# Patient Record
Sex: Male | Born: 1955 | Race: White | Hispanic: No | Marital: Married | Smoking: Current every day smoker
Health system: Southern US, Community
[De-identification: ages and names within clinical notes are randomized; demographics above are authoritative.]

## PROBLEM LIST (undated history)

## (undated) DIAGNOSIS — I639 Cerebral infarction, unspecified: Secondary | ICD-10-CM

## (undated) DIAGNOSIS — I219 Acute myocardial infarction, unspecified: Secondary | ICD-10-CM

## (undated) DIAGNOSIS — E119 Type 2 diabetes mellitus without complications: Secondary | ICD-10-CM

---

## 2005-07-15 ENCOUNTER — Inpatient Hospital Stay (HOSPITAL_COMMUNITY): Admission: EM | Admit: 2005-07-15 | Discharge: 2005-07-19 | Payer: Self-pay | Admitting: Emergency Medicine

## 2005-07-15 ENCOUNTER — Ambulatory Visit: Payer: Self-pay | Admitting: Cardiology

## 2005-07-16 ENCOUNTER — Encounter (INDEPENDENT_AMBULATORY_CARE_PROVIDER_SITE_OTHER): Payer: Self-pay | Admitting: Neurology

## 2005-07-16 ENCOUNTER — Encounter: Payer: Self-pay | Admitting: Cardiology

## 2005-07-18 ENCOUNTER — Encounter: Payer: Self-pay | Admitting: Cardiology

## 2005-09-30 ENCOUNTER — Ambulatory Visit: Payer: Self-pay | Admitting: Internal Medicine

## 2005-09-30 ENCOUNTER — Inpatient Hospital Stay (HOSPITAL_COMMUNITY): Admission: EM | Admit: 2005-09-30 | Discharge: 2005-10-02 | Payer: Self-pay | Admitting: Emergency Medicine

## 2008-11-02 ENCOUNTER — Ambulatory Visit: Payer: Self-pay | Admitting: Family Medicine

## 2008-11-02 ENCOUNTER — Inpatient Hospital Stay (HOSPITAL_COMMUNITY): Admission: EM | Admit: 2008-11-02 | Discharge: 2008-11-09 | Payer: Self-pay | Admitting: Emergency Medicine

## 2009-05-08 ENCOUNTER — Ambulatory Visit (HOSPITAL_COMMUNITY): Admission: RE | Admit: 2009-05-08 | Discharge: 2009-05-08 | Payer: Self-pay | Admitting: Ophthalmology

## 2009-05-17 ENCOUNTER — Encounter: Payer: Self-pay | Admitting: Cardiology

## 2010-03-05 NOTE — Letter (Signed)
Summary: Discharge Summary 10-02-2005  Discharge Summary 10-02-2005   Imported By: Faythe Ghee 05/17/2009 14:56:56  _____________________________________________________________________  External Attachment:    Type:   Image     Comment:   External Document

## 2010-05-09 LAB — GLUCOSE, CAPILLARY
Glucose-Capillary: 141 mg/dL — ABNORMAL HIGH (ref 70–99)
Glucose-Capillary: 147 mg/dL — ABNORMAL HIGH (ref 70–99)
Glucose-Capillary: 163 mg/dL — ABNORMAL HIGH (ref 70–99)
Glucose-Capillary: 167 mg/dL — ABNORMAL HIGH (ref 70–99)
Glucose-Capillary: 184 mg/dL — ABNORMAL HIGH (ref 70–99)
Glucose-Capillary: 190 mg/dL — ABNORMAL HIGH (ref 70–99)
Glucose-Capillary: 192 mg/dL — ABNORMAL HIGH (ref 70–99)
Glucose-Capillary: 197 mg/dL — ABNORMAL HIGH (ref 70–99)
Glucose-Capillary: 201 mg/dL — ABNORMAL HIGH (ref 70–99)
Glucose-Capillary: 226 mg/dL — ABNORMAL HIGH (ref 70–99)
Glucose-Capillary: 229 mg/dL — ABNORMAL HIGH (ref 70–99)
Glucose-Capillary: 259 mg/dL — ABNORMAL HIGH (ref 70–99)
Glucose-Capillary: 263 mg/dL — ABNORMAL HIGH (ref 70–99)
Glucose-Capillary: 267 mg/dL — ABNORMAL HIGH (ref 70–99)

## 2010-05-09 LAB — COMPREHENSIVE METABOLIC PANEL
ALT: 13 U/L (ref 0–53)
AST: 23 U/L (ref 0–37)
Albumin: 2.2 g/dL — ABNORMAL LOW (ref 3.5–5.2)
Albumin: 2.4 g/dL — ABNORMAL LOW (ref 3.5–5.2)
Albumin: 2.4 g/dL — ABNORMAL LOW (ref 3.5–5.2)
BUN: 133 mg/dL — ABNORMAL HIGH (ref 6–23)
BUN: 18 mg/dL (ref 6–23)
BUN: 30 mg/dL — ABNORMAL HIGH (ref 6–23)
CO2: 27 mEq/L (ref 19–32)
Calcium: 7.3 mg/dL — ABNORMAL LOW (ref 8.4–10.5)
Calcium: 7.4 mg/dL — ABNORMAL LOW (ref 8.4–10.5)
Chloride: 102 mEq/L (ref 96–112)
Chloride: 91 mEq/L — ABNORMAL LOW (ref 96–112)
Creatinine, Ser: 1.24 mg/dL (ref 0.4–1.5)
Creatinine, Ser: 1.46 mg/dL (ref 0.4–1.5)
GFR calc Af Amer: 8 mL/min — ABNORMAL LOW (ref >60)
GFR calc non Af Amer: 7 mL/min — ABNORMAL LOW (ref >60)
Glucose, Bld: 129 mg/dL — ABNORMAL HIGH (ref 70–99)
Glucose, Bld: 231 mg/dL — ABNORMAL HIGH (ref 70–99)
Sodium: 140 mEq/L (ref 135–145)
Total Bilirubin: 0.7 mg/dL (ref 0.3–1.2)
Total Bilirubin: 1.2 mg/dL (ref 0.3–1.2)
Total Protein: 5.5 g/dL — ABNORMAL LOW (ref 6.0–8.3)
Total Protein: 5.9 g/dL — ABNORMAL LOW (ref 6.0–8.3)

## 2010-05-09 LAB — RENAL FUNCTION PANEL
CO2: 26 mEq/L (ref 19–32)
Calcium: 7.1 mg/dL — ABNORMAL LOW (ref 8.4–10.5)
Creatinine, Ser: 2.96 mg/dL — ABNORMAL HIGH (ref 0.4–1.5)
GFR calc Af Amer: 27 mL/min — ABNORMAL LOW (ref >60)
GFR calc non Af Amer: 22 mL/min — ABNORMAL LOW (ref >60)
Glucose, Bld: 164 mg/dL — ABNORMAL HIGH (ref 70–99)
Potassium: 3 mEq/L — ABNORMAL LOW (ref 3.5–5.1)
Sodium: 145 mEq/L (ref 135–145)

## 2010-05-09 LAB — CBC
HCT: 35.2 % — ABNORMAL LOW (ref 39.0–52.0)
HCT: 37.9 % — ABNORMAL LOW (ref 39.0–52.0)
HCT: 38.2 % — ABNORMAL LOW (ref 39.0–52.0)
MCHC: 34.6 g/dL (ref 30.0–36.0)
MCHC: 35 g/dL (ref 30.0–36.0)
MCHC: 35.8 g/dL (ref 30.0–36.0)
MCV: 94 fL (ref 78.0–100.0)
MCV: 94.6 fL (ref 78.0–100.0)
MCV: 94.9 fL (ref 78.0–100.0)
Platelets: 138 10*3/uL — ABNORMAL LOW (ref 150–400)
Platelets: 158 10*3/uL (ref 150–400)
Platelets: 160 10*3/uL (ref 150–400)
Platelets: 162 10*3/uL (ref 150–400)
RBC: 4.03 MIL/uL — ABNORMAL LOW (ref 4.22–5.81)
RDW: 11.9 % (ref 11.5–15.5)
RDW: 12.4 % (ref 11.5–15.5)
WBC: 10.8 10*3/uL — ABNORMAL HIGH (ref 4.0–10.5)

## 2010-05-09 LAB — BASIC METABOLIC PANEL
BUN: 10 mg/dL (ref 6–23)
BUN: 13 mg/dL (ref 6–23)
BUN: 24 mg/dL — ABNORMAL HIGH (ref 6–23)
CO2: 27 mEq/L (ref 19–32)
CO2: 28 mEq/L (ref 19–32)
Calcium: 7.3 mg/dL — ABNORMAL LOW (ref 8.4–10.5)
Calcium: 7.7 mg/dL — ABNORMAL LOW (ref 8.4–10.5)
Calcium: 8.8 mg/dL (ref 8.4–10.5)
Creatinine, Ser: 0.88 mg/dL (ref 0.4–1.5)
Creatinine, Ser: 1.27 mg/dL (ref 0.4–1.5)
GFR calc Af Amer: >60 mL/min (ref >60)
GFR calc Af Amer: >60 mL/min (ref >60)
GFR calc non Af Amer: 59 mL/min — ABNORMAL LOW (ref >60)
GFR calc non Af Amer: >60 mL/min (ref >60)
Glucose, Bld: 140 mg/dL — ABNORMAL HIGH (ref 70–99)
Glucose, Bld: 156 mg/dL — ABNORMAL HIGH (ref 70–99)
Glucose, Bld: 201 mg/dL — ABNORMAL HIGH (ref 70–99)
Potassium: 3.9 mEq/L (ref 3.5–5.1)
Potassium: 4.3 mEq/L (ref 3.5–5.1)
Sodium: 139 mEq/L (ref 135–145)
Sodium: 142 mEq/L (ref 135–145)
Sodium: 143 mEq/L (ref 135–145)

## 2010-05-09 LAB — MAGNESIUM
Magnesium: 1.3 mg/dL — ABNORMAL LOW (ref 1.5–2.5)
Magnesium: 1.5 mg/dL (ref 1.5–2.5)

## 2010-05-09 LAB — PHOSPHORUS
Phosphorus: 2.2 mg/dL — ABNORMAL LOW (ref 2.3–4.6)
Phosphorus: 2.7 mg/dL (ref 2.3–4.6)
Phosphorus: 2.7 mg/dL (ref 2.3–4.6)

## 2010-05-10 LAB — COMPREHENSIVE METABOLIC PANEL
ALT: 12 U/L (ref 0–53)
AST: 29 U/L (ref 0–37)
Albumin: 3.3 g/dL — ABNORMAL LOW (ref 3.5–5.2)
CO2: 14 mEq/L — ABNORMAL LOW (ref 19–32)
Chloride: 89 mEq/L — ABNORMAL LOW (ref 96–112)
Creatinine, Ser: 11.09 mg/dL — ABNORMAL HIGH (ref 0.4–1.5)
Potassium: 4.7 mEq/L (ref 3.5–5.1)
Sodium: 135 mEq/L (ref 135–145)
Total Bilirubin: 1.8 mg/dL — ABNORMAL HIGH (ref 0.3–1.2)

## 2010-05-10 LAB — URINE MICROSCOPIC-ADD ON

## 2010-05-10 LAB — BASIC METABOLIC PANEL
BUN: 135 mg/dL — ABNORMAL HIGH (ref 6–23)
Calcium: 7.5 mg/dL — ABNORMAL LOW (ref 8.4–10.5)
GFR calc non Af Amer: 5 mL/min — ABNORMAL LOW (ref >60)
Glucose, Bld: 269 mg/dL — ABNORMAL HIGH (ref 70–99)

## 2010-05-10 LAB — GLUCOSE, CAPILLARY

## 2010-05-10 LAB — DRUG SCREEN PANEL (SERUM)

## 2010-05-10 LAB — DIFFERENTIAL
Basophils Absolute: 0 10*3/uL (ref 0.0–0.1)
Lymphs Abs: 1.7 10*3/uL (ref 0.7–4.0)
Monocytes Absolute: 1.5 10*3/uL — ABNORMAL HIGH (ref 0.1–1.0)
Monocytes Relative: 7 % (ref 3–12)
Neutro Abs: 17.7 10*3/uL — ABNORMAL HIGH (ref 1.7–7.7)

## 2010-05-10 LAB — URINALYSIS, ROUTINE W REFLEX MICROSCOPIC
Bilirubin Urine: NEGATIVE
Glucose, UA: 100 mg/dL — AB
Protein, ur: >300 mg/dL — AB
Specific Gravity, Urine: 1.018 (ref 1.005–1.030)

## 2010-05-10 LAB — CK: Total CK: 592 U/L — ABNORMAL HIGH (ref 7–232)

## 2010-05-10 LAB — PHOSPHORUS: Phosphorus: 6.3 mg/dL — ABNORMAL HIGH (ref 2.3–4.6)

## 2010-05-10 LAB — CBC
Hemoglobin: 17.3 g/dL — ABNORMAL HIGH (ref 13.0–17.0)
MCHC: 34.8 g/dL (ref 30.0–36.0)
RBC: 5.28 MIL/uL (ref 4.22–5.81)
WBC: 20.9 10*3/uL — ABNORMAL HIGH (ref 4.0–10.5)

## 2010-05-10 LAB — ETHANOL: Alcohol, Ethyl (B): <5 mg/dL (ref 0–10)

## 2010-06-21 NOTE — Discharge Summary (Signed)
NAME:  Glenn, [name] NO.:  [id number]   MEDICAL RECORD NO.:  [id number]          PATIENT TYPE:  INP   LOCATION:  6526                         FACILITY:  [facility]   PHYSICIAN:  Salvadore Farber, MD  DATE OF BIRTH:  Nov 10, 1955   DATE OF ADMISSION:  09/30/2005  DATE OF DISCHARGE:  10/02/2005                           DISCHARGE SUMMARY - REFERRING   DISCHARGE DIAGNOSES:  1. Acute coronary artery syndrome.  2. One-vessel coronary artery disease status post bare-metal stenting to      the left anterior descending coronary artery.  3. Hyperlipidemia.  4. Hyperglycemia with recent diagnosis of diabetes.  5. History as noted below.   PROCEDURES PERFORMED:  Cardiac catheterization October 01, 2005, with bare-  metal stenting to the mid LAD as described by Dr. Samule Ohm.   SUMMARY OF HISTORY:  Glenn [name] is a 55 year old white male who on the  afternoon of admission, while watching television, he developed acute  substernal chest discomfort radiating into his right shoulder associated  with shortness of breath.  He eventually called his step-mother who at the  time activated EMS.  His chest discomfort was relieved with two sublingual  nitroglycerin.   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  A TE echocardiogram at that time  showed an EF of 40-45% with mid distal anterior septal wall hypokinesis.  Eventual catheterization was planned once he recovered from his stroke.  He  also has a history of hyperlipidemia.   LABORATORY DATA:  Admission H&H was 14.0 and 39.8, normal indices, platelets  244, wbc's 7.6.  Prior to discharge, H&H was 13.6 and 39.7, normal indices,  platelets 217, wbc's 7.1.  PTT is 61, PT 13.6.  On admission, sodium was  139, potassium 38, BUN 10, creatinine 0.9, glucose 153, normal LFTs.  Prior  to discharge,  potassium was 4.0, BUN 10, creatinine 0.8.  Hemoglobin A1c was  slightly elevated at 6.8.  CK-MBs and relative index were within normal  limits x4, troponins x2.  EKGs showed normal sinus rhythm, delayed R-wave, T-  wave inversion V1 through V3, diffuse J-point elevation.  Subsequent EKGs  were the same.   HOSPITAL COURSE:  Glenn [name] was admitted to [facility] and  placed on IV heparin as well as his home medications.  It was felt that he  should undergo cardiac catheterization.  This was performed by Dr. Samule Ohm on  October 01, 2005, without difficulty.  He had a 99% mid LAD with TIMI 2 flow.  A Monorail bare-metal stent was placed, reducing this lesion from 99% to 0%  percent by Dr. Samule Ohm.  Dr. Samule Ohm felt he should continue Aggrenox  indefinitely, Plavix for at least 1 year.  Post sheath removal and bedrest  the patient was ambulating without difficulty.  Catheterization site was  intact.  After review by Dr. Samule Ohm on October 02, 2005, it was felt that he  could be discharged home.   DISPOSITION:  Glenn [name]  was discharged home.  He is asked to maintain low  salt/fat/cholesterol ADA diet.  His activities are per supplemental  discharge sheet.  He was asked to bring all medications and his sugar  results to all appointments.   New medications include:  1. Plavix 75 mg daily at least for 1 year.  2. Nitroglycerin 0.4 as needed.  3. Lisinopril 10 mg daily.   He is asked to continue his medications:  1. Aggrenox b.i.d.  2. Resume metformin 1000 mg b.i.d. on October 03, 2005.  3. Metoprolol 12.5 mg b.i.d.  4. Simvastatin 40 mg q.h.s.  5. Glyburide 10 mg b.i.d.   He will follow up with Dr. Jens Som on October 20, 2005, at 12 p.m.  At  that time blood work in regards to fasting lipids and LFTs should be  arranged since his simvastatin was started in June.  He was also asked to  arrange a followup appointment with Dr. Lyn Hollingshead for within 1-2 weeks to  follow up on  better control of his diabetes.   Discharge time less than 30 minutes.     ______________________________  Joellyn Rued, PA-C      Salvadore Farber, MD  Electronically Signed    EW/MEDQ  D:  10/02/2005  T:  10/02/2005  Job:  [id number]   cc:   Marcelo Baldy, MD  Madolyn Frieze. Jens Som, MD, [facility]

## 2010-06-21 NOTE — Discharge Summary (Signed)
NAME:  Glenn [name] NO.:  [id number]   MEDICAL RECORD NO.:  [id number]          PATIENT TYPE:  INP   LOCATION:  3704                         FACILITY:  [facility]   PHYSICIAN:  Pramod P. Pearlean Brownie, MD    DATE OF BIRTH:  Jun 25, 1955   DATE OF ADMISSION:  07/15/2005  DATE OF DISCHARGE:  07/19/2005                                 DISCHARGE SUMMARY   ADMISSION DIAGNOSIS:  Stroke.   DISCHARGE DIAGNOSES:  1.  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  2.  Silent coronary artery disease, newly identified.  3.  Hyperlipidemia.  4.  Smoking.   HOSPITAL COURSE:  Glenn [name] is a 55 year old Caucasian male who was  admitted with symptoms of sudden onset of left-sided weakness and slurred  speech.  The patient was earlier seen at [facility]  during the day.  At that time he had what appeared to be a near syncopal  episode.  He felt quite tired, lightheaded and dizzy.  He had to sit down  there.  His sugar was found to be 3 and then 35 on arrival.  He was given  some fluids and medications were adjusted.  He had no focal neurological  symptoms at that time.  The patient was all right for the rest of the day;  but, in the evening, was driving with friend.  He recalls an onset of  slurred speech and left-sided weakness.   He was brought to the [facility] emergency room by EMS and a code stroke was  called at 1640 hours.  For unclear reasons, the neurologist on-call did not  get a page until 1930.  The patient was found to have some fluctuating left-  sided weakness at that time and, since the time of onset was not clear, he  did not qualify for thrombolysis.   The patient's vascular risk factors included diabetes which had been  recently discovered; and heavy cigarette smoking.  His initial CT scan  showed no acute abnormality.  The patient did meet criteria for IV  thrombolysis despite  the code stroke not being covered appropriately; and  hence Dr. Thad Ranger gave the patient 0.9 mg/kg IV t-PA which was administered  uneventfully, and the patient actually did well and showed some improvement  after the t-PA was infused.  He was kept in the neurological intensive care  unit for 24 hours, and blood pressure was carefully monitored and treated  aggressively.   Subsequently an MRI scan of the brain was obtained which showed a small  infarct in the right parietal region affecting the cortex and the white  matter.  MRA did not reveal significant large vessel stenosis.  Cholesterol  profile showed total cholesterol 197, triglycerides were elevated at 236,  and LDL at 120, HDL was 30.  The patient's hemoglobin A1C was significantly  elevated at 10.3.  Cardiac enzymes and troponin x three were negative.  Alcohol level was negative.  Urine drug screen was negative.  2-D  echocardiogram was a limited study, but  did show some anterior apical  hypokinesia.  Carotid ultrasound showed no significant stenosis.  A  cardiology consultation was obtained because of his abnormal echocardiogram  findings; and a transesophageal echocardiogram was done by Dr. Olga Millers which did not reveal any definite cardiac clot or patent foramen  ovale.  However, there was significant anterior wall and apical hypokinesia  noted.  Initially Dr. Jens Som thought about doing elective cardiac  catheterization and echocardiogram; but, since he had had the recent stroke,  he decided to do it later as an outpatient.   The patient was started on Toprol XL 25 mg per day; as well as Plavix.  The  patient was counseled to quit smoking, which he agreed.  He was advised to  establish strict [facility] [place];  and advised to take his medications regularly.  At the time of discharge,  the patient had some minimum proprioceptive deficits on the left side.  His  fine finger  movements are diminished on the left.  He had slight weakness of  the left grip.  He was dragging his left foot.  He was able to ambulate  independently.  He was seen by physical and occupational therapy and they  recommended outpatient therapy and arrangements were made for the same.   At the time of discharge, his medications are as follows:  Aspirin 325 mg  per day; Plavix 75 mg per day; Toprol XL 25 mg per day; Zocor 20 mg per day;  glyburide XL 5 mg per day; Metformin 1,000 mg twice per day.   The patient was advised to follow up with Dr. Jens Som in two weeks; with  Dr. Izell Red Hill in two months; and with his primary care physician in the  [facility] as needed.           ______________________________  Sunny Schlein. Pearlean Brownie, MD     PPS/MEDQ  D:  07/19/2005  T:  07/19/2005  Job:  [id number]   cc:   [facility]   Olga Millers, M.D. [facility]  [address]  [address]  [place]  [place] [address]

## 2010-06-21 NOTE — Cardiovascular Report (Signed)
NAME:  DOLPH, TAVANO NO.:  [id number]   MEDICAL RECORD NO.:  [id number]          PATIENT TYPE:  INP   LOCATION:  6526                         FACILITY:  [facility]   PHYSICIAN:  Salvadore Farber, MD  DATE OF BIRTH:  [birth year]   DATE OF PROCEDURE:  10/01/2005  DATE OF DISCHARGE:  10/02/2005                              CARDIAC CATHETERIZATION   PROCEDURE:  Left heart catheterization, left ventriculography, coronary  angiography, bare metal stent placement in the mid left anterior descending,  intravascular ultrasound of the left anterior descending, Star close closure  of the right common femoral arteriotomy site.   CARDIOLOGIST:  Salvadore Farber, M.D.   INDICATIONS:  Mr. Henningsen is a 55 year old gentleman with recently  diagnosed diabetes mellitus and hypercholesterolemia.  He suffered an  ischemic stroke in June of this year treated with TPA.  He has moderate  residual deficit.  Were he at the time of the stroke he was found to have  ejection fraction approximately 45% with anterolateral hypokinesis.  Plan  was for further cardiac evaluation subsequently.  He now presents having had  an episode of chest discomfort occurring at rest.  Initial cardiac enzymes  were normal.  Electrocardiogram was without ischemic changes.  Due to his  known impaired left ventricular systolic function, he was referred directly  for cardiac catheterization and possible percutaneous coronary intervention.   PROCEDURAL TECHNIQUE:  Informed consent was obtained.  Underwent 1%  lidocaine local anesthesia, a 6 French sheath was placed in the right common  femoral artery using the modified Seldinger technique.  Diagnostic  angiography and ventriculography were performed using JL-4, JR-4, and  pigtail catheters.  These images demonstrated 99% stenosis of the mid-LAD.  Decision was made to proceed to percutaneous revascularization.   Additional heparin was given to achieve and maintain  an ACT of greater than  200 seconds.  Double bolus eptifibatide and 600 mg of Plavix were  administered.  A 6-French  CLS 3.5 guide was advanced over the wire and  engaged in the ostium of left main.  A Prowater wire was advanced to the  distal LAD without difficulty.  The lesion was predilated using a 2.559 mm  Maverick for two inflations, each at 6 atmospheres.  I then proceeded to  stenting using a 3.0 x 24 mm Liberty stent deployed at 16 atmospheres.  I  post dilated the distal portion of the stent using a 3.25 x 20 mm Quantum at  15 atmospheres.  I then post dilated the proximal portion of the stent using  a 3.5 x 20 mm Quantum at 16 atmospheres.  I then performed intravascular  ultrasound with an automated pullback.  This demonstrated excellent stent  apposition and expansion distally.  However, in the proximal 3-4 mm the  stent was not opposed.  The proximal reference vessel measured 3.67 x 3.92  mm.  I then proceeded to post dilate the stent further using a 3.75 x 12 mm  Quantum balloon at 18 atmospheres.  I then repeated intravascular ultrasound  which now demonstrated complete apposition.  Final angiography demonstrated  no  residual stenosis, no dissection, and TIMI-3 flow to the distal  vasculature.  The jailed diagonal had no stenosis and normal flow as well.   The arteriotomy was then closed using a Star close device.  Complete  hemostasis was obtained.  The patient was then transferred to the holding  room in stable condition having tolerated the procedure well.   COMPLICATIONS:  None.   FINDINGS:  1. Left ventricle:  103/15/20.  Ejection fraction 45% with global      hypokinesis.  2. Aortic stenosis or mitral regurgitation.  3. Left main:  Angiographically normal.  4. Left anterior descending:  Moderate-sized vessel giving rise to single      diagonal.  There was a 99% stenosis of the midvessel with TIMI-2 flow.      This was stented to no residual.  This residual  stenosis with      establishment of TIMI-3 flow.  5. Circumflex:  Moderate-sized dominant vessel.  It is angiographically      normal.  6. Right coronary artery:  Moderate-sized nondominant vessel.  It is      angiographically normal.   PLAN:  Successful placement of bare metal stent in the mid LAD reducing  stenosis from 99% to 0%.  He has no other significant atherosclerotic  coronary disease.  Will plan on continuing his Aggrenox and adding Plavix  for a year.   Patient has a brain MRI scheduled for next week.  I have asked him to defer  this for a month.      Salvadore Farber, MD  Electronically Signed     WED/MEDQ  D:  10/01/2005  T:  10/02/2005  Job:  [id number]   cc:   Marcelo Baldy, M.D.  Madolyn Frieze Jens Som, MD, [facility]

## 2010-06-21 NOTE — H&P (Signed)
NAME:  DANE, KOPKE NO.:  [id number]   MEDICAL RECORD NO.:  [id number]          PATIENT TYPE:  INP   LOCATION:  3735                         FACILITY:  [facility]   PHYSICIAN:  Bevelyn Buckles. Bensimhon, MDDATE OF BIRTH:  February 17, 1955   DATE OF ADMISSION:  09/30/2005  DATE OF DISCHARGE:                                HISTORY & PHYSICAL   REASON FOR ADMISSION:  Unstable angina.   Mr. Lanum is a 55 year old male with a history of COPD and recently  diagnosed diabetes.  He was admitted in June 2007 with acute right white  matter CVA which was presumed to be embolic.  He was treated with t-PA.  He  subsequently underwent a TE which revealed a LV ejection fraction of 40-45%  with hypokinesis of the mid to distal anterior septal wall as well as the  periapical region.  The plan was for eventual cath but was deferred due to  his lack of angina and desire to give him time to recover from his stroke.  He was doing reasonably well since his stroke without any cardiopulmonary  symptoms until this afternoon while he was watching television and developed  acute substernal chest pain at rest radiating to his right shoulder.  This  was accompanied by shortness of breath.  He eventually called his stepmother  down to the basement rate where he was at, EMS was activated and arrived  quickly.  His chest pain is relieved promptly with two sublingual  nitroglycerins.  He is currently pain free.   REVIEW OF SYSTEMS:  He denies any orthopnea or PND.  No lower extremity  edema.  There is not any claudication.  No fevers or chills. No cough.  No  bright red blood per rectum.  No melena.  He does have some residual  dysarthria and left lower extremity weakness.  The remainder of the review  of systems is negative except for HPI and problem list.   PROBLEMS LIST:  1. Recent acute right white matter CVA in June 2007 treated with t-PA with      mild residual dysarthria, right facial droop, and  left lower extremity      weakness.  2. Mild LV dysfunction.      a.     Transesophageal echocardiogram June 2007 with EF of 40-45% with       hypokinesis of the mid to distal anterior septal wall and periapical       region.  3. Hyperlipidemia.  4. Diabetes, recent diagnosis.  5. History of tobacco use, quit in June 2007.  6. Previously homeless now living with his father and stepmother.   CURRENT MEDICATIONS:  Metoprolol 12.5 mg p.o. b.i.d., metformin 1000 mg  b.i.d., Aggrenox 25 mg/200 mg p.o. b.i.d., glyburide 10 mg b.i.d., and  simvastatin 40 mg a day.   ALLERGIES:  No known drug allergies.   SOCIAL HISTORY:  He previously was homeless. He currently lives in  [place] with his father and stepmother.  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  He has smoked tobacco 1-2  packs a day for 25 years, he quit in June 2007.  He has no history of  alcohol abuse.  He denies drug use.   FAMILY HISTORY:  Mother is deceased with complications from Doreatha Martin  disease.  Father is alive at age 91 with no known heart disease.  Siblings  with no known heart disease.   PHYSICAL EXAMINATION:  GENERAL:  He well appearing in no acute distress.  VITAL SIGNS:  He is afebrile.  Blood pressure is 108/71 with a heart rate  69.  He is satting at 99% on room air.  HEENT: Sclerae anicteric.  EOMI.  There is no xanthelasma's.  Mucous  membranes are moist.  NECK:  Supple.  There is no JVD.  Carotids are 2+ bilateral without any  bruits.  There is no lymphadenopathy or thyromegaly.  CARDIAC:  Regular rate and rhythm with no obvious murmurs, rubs or gallops.  LUNGS: Clear.  ABDOMEN:  Soft, nontender, nondistended.  There is no past thyromegaly.  No  masses.  No bruits appreciated.  Good bowel sounds.  EXTREMITIES:  Warm with no cyanosis, clubbing or edema.  Femoral pulses are  2+ bilateral without any bruits.  NEURO:  He is alert and oriented x3.  He has a  mild right facial droop and  some mild dysarthria.  Strength in the upper extremities is normal.  He has  some mild weakness in the left lower extremity.  There is no pronator drift.   EKG shows normal sinus rhythm at 73 with no ST-T wave changes.  Chest x-ray  shows no acute disease.  Labs show a white count 7.6, hemoglobin 14,  platelet 244.  Sodium is 139, potassium is 3.8, creatinine is 0.9, glucose  is 153, chloride is 111, bicarb is 24, BUN is 10.  Troponin is less than  0.05 x3.  CK-MB is less than 1 x2 on point of care.   ASSESSMENT:  1. Chest pain concerning for unstable angina.  2. Mild LV dysfunction with anterior septal and apical hypokinesis on      recent echocardiogram.  3. Recent CVA, presumed embolic.  4. Diabetes.  5. COPD recently quit tobacco.  6. Hyperlipidemia.   DISPOSITION/PLAN:  We will admit him to telemetry for complete rule out  myocardial infarction.  We will plan for cardiac catheterization in the  morning.  We will continue Aggrenox, start heparin per pharmacy, treat him  with nitroglycerin, beta blocker, and statin.  We will hold metformin and  will plan to start ACE inhibitor after cath if his blood pressure allows for  his LV dysfunction.      Bevelyn Buckles. Bensimhon, MD  Electronically Signed     DRB/MEDQ  D:  09/30/2005  T:  09/30/2005  Job:  [id number]   cc:   Marcelo Baldy, M.D.  Madolyn Frieze Jens Som, MD, [facility]

## 2010-06-21 NOTE — H&P (Signed)
NAME:  Glenn [name], Glenn [name] NO.:  [id number]   MEDICAL RECORD NO.:  [id number]          PATIENT TYPE:  INP   LOCATION:  1829                         FACILITY:  [facility]   PHYSICIAN:  Michael L. Reynolds, M.D.DATE OF BIRTH:  [birth year]   DATE OF ADMISSION:  07/15/2005  DATE OF DISCHARGE:                                HISTORY & PHYSICAL   CHIEF COMPLAINT:  Slurred speech and left-sided weakness.   HISTORY OF PRESENT ILLNESS:  This is the initial [facility]  admission for this 55 year old male with a past medical history which  includes a recent diagnosis of diabetes. The patient was seen today at the  [facility]. He had an appointment there to get his blood  pressure checked, but upon arrival he felt lightheaded and presyncopal. He  was seen in the emergency room. He reports his blood pressure and blood  glucose were elevated. He was given IV fluids and his medications were  adjusted. He did not have any focal weakness at that time. He felt  lightheaded and  presyncopal. He was seen in the emergency room. He reports  his blood pressure and blood glucose were elevated. He was given IV fluids.  His medications were adjusted. He did not have any focal weakness at that  time. He came back to the [place] area, picked up a friend from work just  before 5, and went to purchase cigarettes and get his medication filled. He  states that he recalls driving his car, preparing to pull out in traffic at  approximately 5:15 p.m. He recalls reaching for a soft drink and having the  contents spill out of his mouth and onto his shirt. His friend immediately  noted his speech was slurred and the left side of his face was drooping. EMS  was alert and the patient was brought to [facility] emergency room.  According to the ED records, code stroke was called at about 18:40. However,  for unknown reasons, I did not receive a page until  approximately 19:30  when it came through the answering service. The patient says he noticed no  limb weakness he until in the emergency room and was going to CT when he  noticed that his left arm felt numb.  The patient states he has had  fluctuating left upper extremity weakness. He denies any history of previous  similar events,but he does report that about a year ago his right hand was  numb for about three days and it recovered.   He was diagnosed with diabetes about six weeks ago at a routine DOT  physical. He has since that time had medications adjusted by doctors in  [place] as well as at the [place] in [place]. He has been on increasing doses  of oral medications. He only today received a glucometer to check his blood  sugars at home. He has no known hypertension, hypercholesterolemia, or heart  disease, although the patient does admit that he does not go to doctors  regularly.   FAMILY HISTORY:  Remarkable for diabetes in his sister. His mother died of  ALS.   SOCIAL HISTORY:  He smokes one and a half to two packs of cigarettes a day  and has for the last 25 years. He denies any history of alcohol or illicit  drug use. He has previously worked as a Naval architect and was trying to get  work doing that when his diabetes was discovered. He has not worked since  November of last year. He has been living for the past few days in a  homeless shelter.   ALLERGIES:  No known drug allergies.   MEDICATIONS:  1.  Glucophage 500 mg p.o. t.i.d., increased today to 1000 mg b.i.d.  2.  Glucotrol 5 mg daily, started today. He states that he discontinued      diabetes medication which was 4 mg and started with a G today.   REVIEW OF SYSTEMS:  He says about a year ago he had some exertional chest  pain whenever he would work on the outside of his truck. He thought that  this was indigestion and would take Tums for it and lie down. This  eventually got better. This has not bothered him so much recently. He  denies  having any associated shortness of breath. A full 10-system review of  systems is negative except as outlined in the HPI and in the admission  nursing record.   PHYSICAL EXAMINATION:  VITAL SIGNS: Temperature 99.2, blood pressure 152/92  and down to 131/86, pulse 69, respirations 16.  GENERAL/MENTAL STATUS: This is a somewhat unkempt, but awake and alert man,  supine in the hospital bed, in no evident distress.  HEENT: Cranium normocephalic and atraumatic. Oropharynx is benign.  NECK: Supple without carotid bruits.  HEART: Regular rate and rhythm without murmurs.  CHEST: Clear to auscultation bilaterally.  ABDOMEN: Soft, normoactive bowel sounds, no hepatomegaly.  EXTREMITIES: Trace edema, 2+ pulses.  NEUROLOGIC: Mental status--he is awake and alert. He is fully oriented to  time and place. His speech is moderately dysarthric, but is normal in  content. He is able to name objects and repeat phrases. He is somewhat  anxious. Cranial nerves reveal pupils are equal and reactive. Extraocular  movements are full without nystagmus. Visual fields are full. He has evident  left facial weakness. Tongue and palate move symmetrically. Motor exam  reveals normal bulk and tone. He has fluctuating weakness some times as  great as 4/5 and some times as weak as 1 to 2 over 5 in his left upper  extremity. Strength on the left lower extremity on the right side are full.  Sensation is intact to pinprick on the left than on the right. Cerebellar  reveals rapid movements are performed slowly on the left. Finger-to-nose is  performed  adequately. Gait exam is deferred.   LABORATORY REVIEW:  Hemoglobin is 15.3. Sodium 141, potassium 33.8, chloride  109, BUN/creatinine 6/0.7 respectively, glucose of 158. Coags are normal.  Alcohol level is negative.   CT of head is reviewed. There is a small calcification in the deep white matter on the right which seems to be adjacent to a small cavernous  angioma.  There is no acute finding on the MRI.   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 Risk factors include diabetes.   PLAN:  Received intravenous TPA starting at 8 p.m. Once the infusion is  finished he will be admitted to the ICU for routine post TPA care. He will  eventually need stroke workup including MRI, MRA, carotid  and transcranial  Dopplers, 2-D echocardiogram, and stroke labs. He will also need excellent  diabetic control and workup for other risk factors including possible  hypercholesterolemia and he will also have his tobacco abuse issues  addressed. Of note his EKG demonstrates poor R-wave progression and he may  have had problems with coronary artery disease as well in the past, and this  may require workup at some point. Stroke service to follow.      Michael L. Thad Ranger, M.D.  Electronically Signed    MLR/MEDQ  D:  07/15/2005  T:  07/15/2005  Job:  [id number]

## 2010-06-21 NOTE — Consult Note (Signed)
NAME:  Glenn [name] NO.:  [id number]   MEDICAL RECORD NO.:  [id number]          PATIENT TYPE:  INP   LOCATION:  3704                         FACILITY:  [facility]   PHYSICIAN:  Olga Millers, M.D. [facility] OF BIRTH:  [birth year]   DATE OF CONSULTATION:  07/17/2005  DATE OF DISCHARGE:                                   CONSULTATION   PRIMARY CARDIOLOGIST:  Olga Millers, M.D. [facility] (new)   REFERRING PHYSICIAN:  Pramod P. Pearlean Brownie, MD of the stroke team.   REASON FOR CONSULTATION:  Glenn [name] is a 55 year old male with no prior  cardiac history, who presented two days ago with acute right brain stroke  successfully treated with tPA with now complete resolution of his left  hemiparesis and only some mild residual dysarthria and right facial droop,  referred for further evaluation of possible syncopal episode earlier that  day.   The patient recalls presenting to [facility] for routine visit.  While there, however, he felt lightheaded and had some associated nausea and  generalized weakness.  However, he did not have any associated chest pain,  dyspnea or diaphoresis.  He was administered to with intravenous fluids and  insulin drip and subsequently discharged with the patient reportedly  feeling great.  Later that day, while driving out of a parking lot with  his friend in the passenger side, he suddenly noted that he was dribbling  his [employer] drink down the right side of his mouth and was also unable  to formulate any words.  This was noted by his friend who managed to park  the car, and the patient was subsequently taken to the emergency room.  It  was there that he states that he then developed weakness and numbness of his  left hand.  The patient was subsequently treated with tPA and currently  reports complete restoration of his left arm strength.   The patient definitely reports no loss of consciousness either the morning  of the stroke or while  later that same day.   A 2-D echocardiogram suggests preservation of left ventricular function with  question of apical/posterior wall hypokinesis.   ALLERGIES:  NO KNOWN DRUG ALLERGIES.   MEDICATIONS PRIOR TO ADMISSION:  1.  Glucophage 1000 mg b.i.d.  2.  Glucophage 5 mg daily.   PAST MEDICAL HISTORY:  1.  Type 2 diabetes mellitus (recently diagnosed).  2.  Longstanding tobacco smoking.   SURGERIES:  None.   SOCIAL HISTORY:  The patient currently lives here in the [place] at the  homeless shelter, but is planning to move in with his parents.  He is  divorced and has no children.  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  He admits to smoking at least 1-2 packs a  day and has been doing so for 25 years.  He denies drinking any alcohol for  at least the last 10 years, and denies any prior history of abuse.  He also  denies any illicit drug use.   FAMILY HISTORY:  Mother deceased, complications from Watts  Gehrig disease.  Father, age 66, with no known heart disease.  Siblings have no known heart  disease as well.   REVIEW OF SYSTEMS:  The patient denies any exertional symptoms of chest  discomfort or dyspnea.  He does, however, recall some transient chest pain  approximately one year which responded to Tums.  He is able to climb a  flight of stairs with no associated symptoms.  He denies orthopnea,  paroxysmal nocturnal dyspnea, lower extremity edema, tachy palpitations,  presyncope or claudication.  Denies any recent evidence of hemoptysis,  hematuria, hematochezia or melena.  Denies any symptoms of reflux disease.  The remaining systems negative.   PHYSICAL EXAM:  Blood pressure 126/89, pulse 60, regular, respirations 20,  temperature 96.7, saturations 100% on room air.  GENERAL:  A 55 year old male, sitting upright in no apparent distress.  HEENT:  Normocephalic, atraumatic.  NECK:  Palpable carotid pulses but without bruits; no JVD.   LUNGS:  Clear to auscultation in all fields.  HEART:  Regular rate and rhythm (S1, S2).  Positive S4.  No significant  murmurs.  ABDOMEN:  Soft, nontender, intact bowel sounds without bruits.  No  hepatosplenomegaly.  EXTREMITIES:  Palpable femoral pulses without bruits; intact distal pulses  without edema.  NEURO:  Mild residual dysarthria an right facial droop.  Cranial nerves II-  XII grossly intact; 5/5 bilateral upper and lower strength.  The patient was  also alert and oriented x3.   ACCESSORY DATA:  Admission chest x-ray notable for mild bronchiectatic  changes.  CT scan of the head was negative.  Carotid Doppler showed no  significant internal carotid artery stenosis.   MRA of the brain/neck notable for acute ischemic change in the prefrontal  motor cortex.  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   LABORATORY DATA:  Cardiac enzymes:  CPK, MB and troponin I markers are both  normal.  Hemoglobin 15, hematocrit 45, sodium 141, potassium 3.7, BUN 6,  creatinine 0.9, glucose 162.  INR 1.  Hemoglobin 5.3.  Albumin 3.1.  Hemoglobin A1c 10.3.  Homocystine normal.  Alcohol level less than 5.   Admission EKG:  Normal sinus rhythm at 60 BPM with normal axis; new  interior/inferior T-wave inversion with Q waves in leads V1 and V2 and J-  point elevation in leads V3-V5.   IMPRESSION:  Glenn [name] is a 55 year old male with no known cardiac  history but multiple cardiac risk factors who presented two days ago with an  acute right parietal stroke successfully treated with tPA and now with  complete resolution of his left hemiparesis.   We have been asked to evaluate regarding a possible syncopal episode earlier  that same day; however, the patient denies any frank loss of consciousness.  The patient also presents with EKG changes which are concerning for   ischemia, but with no associated chest discomfort.  Serial cardiac markers  are normal.   PLAN:  We will arrange to proceed with a transesophageal echocardiogram in  the morning to rule out possible embolic source.  Regarding medication, we  agree with treatment of aspirin and statin but will increase Zocor to 40  daily.  Will also add Lopressor at 12.5 b.i.d.  Will also repeat an  electrocardiogram today.   With respect to the transesophageal echocardiogram, if wall motion  abnormalities are noted then the patient will  most likely need a cardiac  catheterization; however, given his recent stroke we will try to avoid this  if at all possible.  If, however, the TEE is normal, then recommendation is  to proceed with a stress perfusion study for risk stratification.  Smoking  cessation is also strongly advised.      Gene Serpe, P.A. [facility]    ______________________________  Olga Millers, M.D. [facility]    GS/MEDQ  D:  07/17/2005  T:  07/17/2005  Job:  [id number]

## 2011-04-20 IMAGING — CT CT ABDOMEN W/O CM
2 of 4 series · 17 of 46 positions shown, 19 images · non-contrast
Comparison: None

CT ABDOMEN

CLINICAL DATA: Diffuse abdominal pain.

CT ABDOMEN AND PELVIS WITHOUT CONTRAST
TECHNIQUE: Multidetector CT imaging of the abdomen and pelvis was
performed following the standard protocol without intravenous
contrast.

[Series 2: abd/pelv w/o 5.0 b31f st · axial · non-contrast · 0.74mm/px · z∈[-502,-28]mm · 14 of 103 slices shown, 16 images]
[im 4/103  soft-tissue]
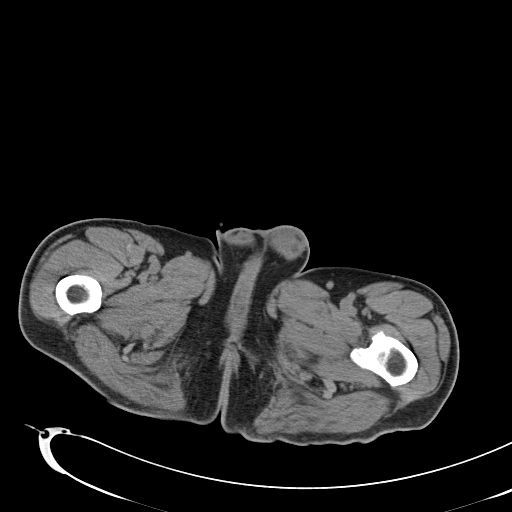
[im 4/103  bone]
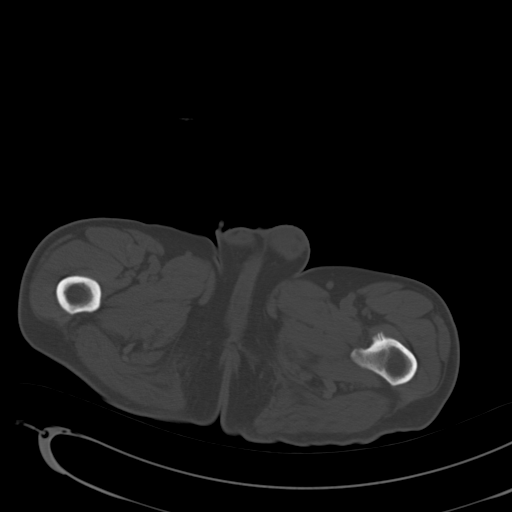
[im 12/103  soft-tissue]
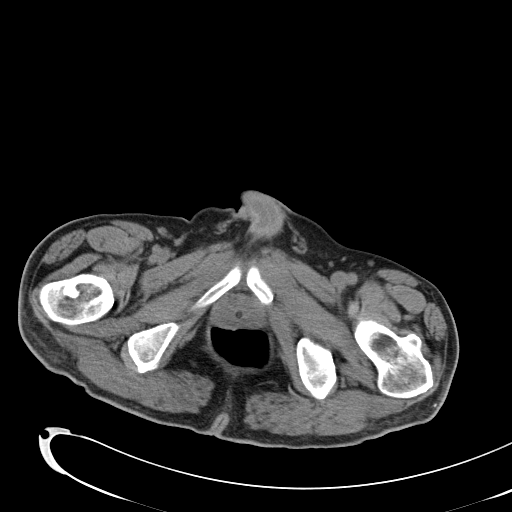
[im 20/103  soft-tissue]
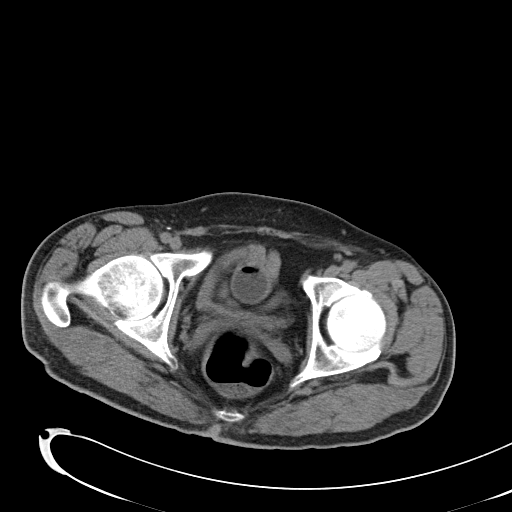
[im 28/103  soft-tissue]
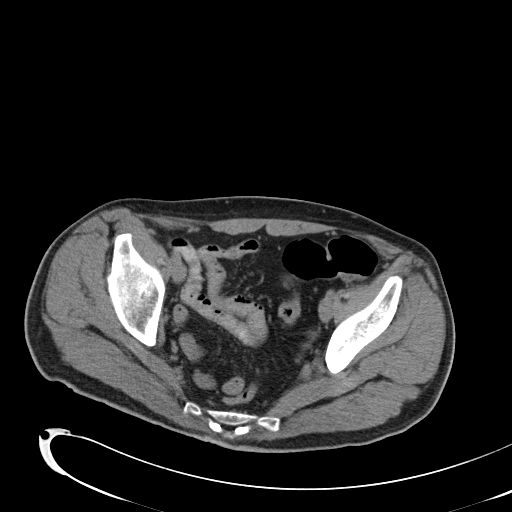
[im 36/103  soft-tissue]
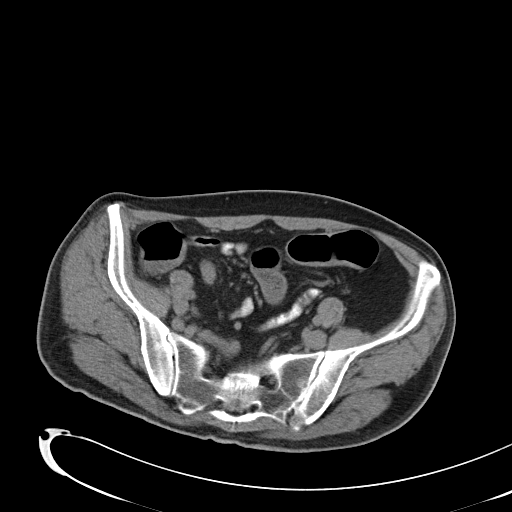
[im 40/103  soft-tissue]
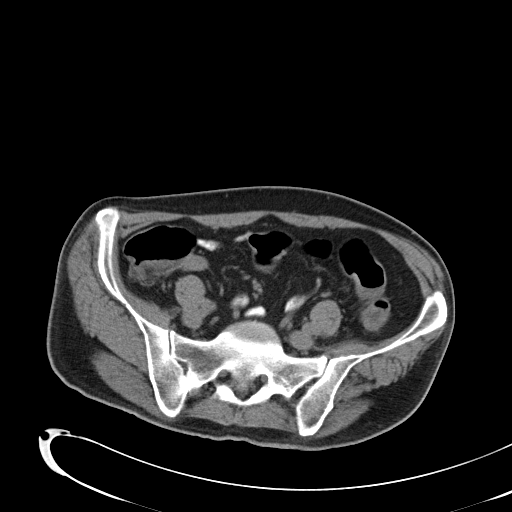
[im 48/103  soft-tissue]
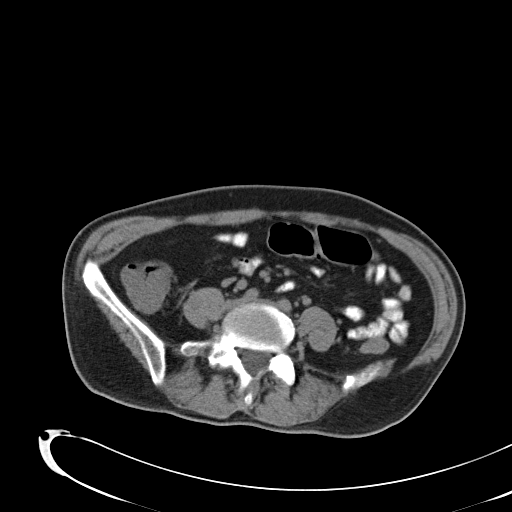
[im 55/103  soft-tissue]
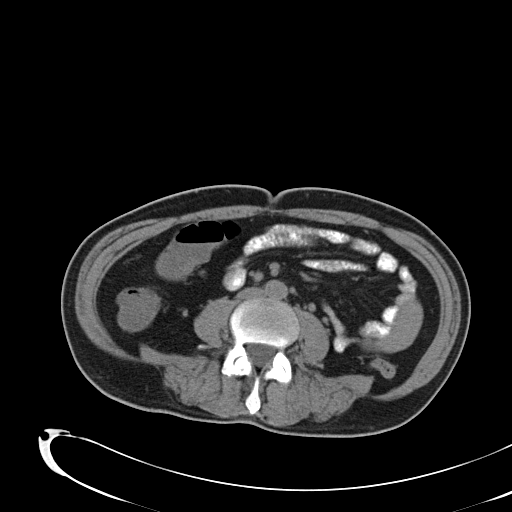
[im 63/103  soft-tissue]
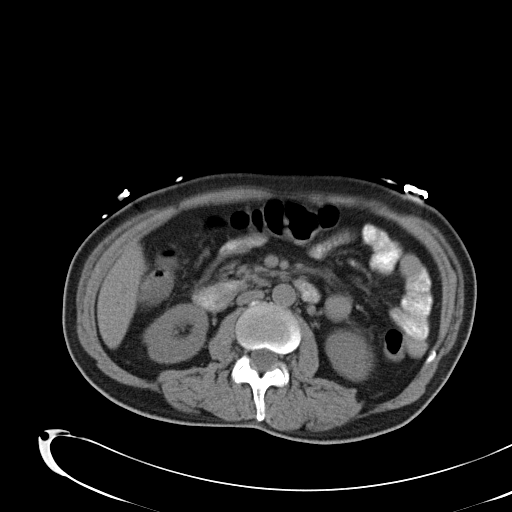
[im 63/103  bone]
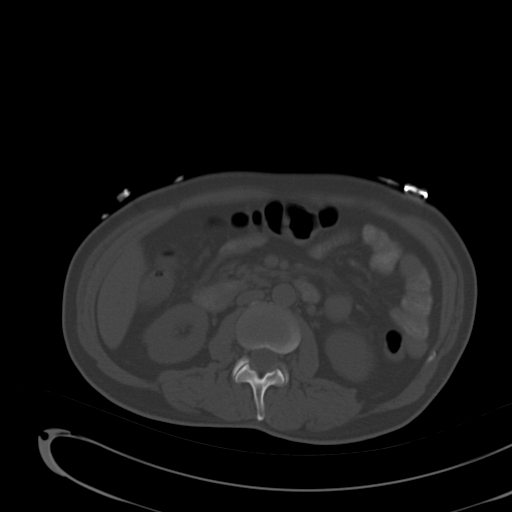
[im 67/103  soft-tissue]
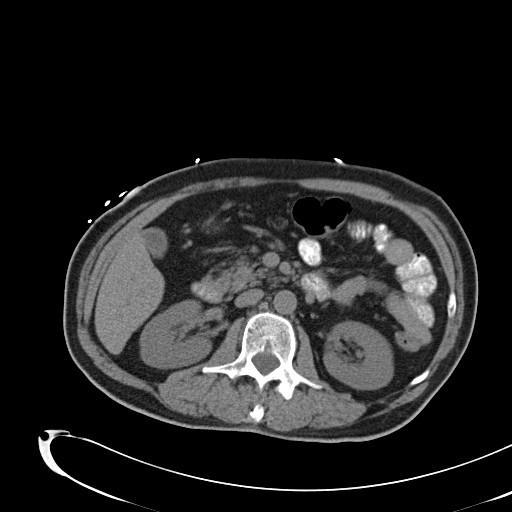
[im 75/103  soft-tissue]
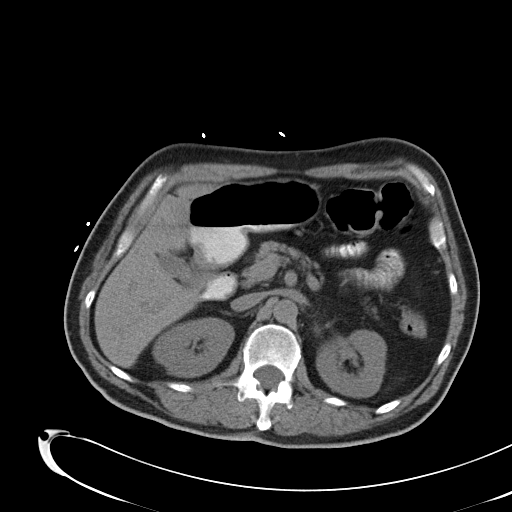
[im 83/103  soft-tissue]
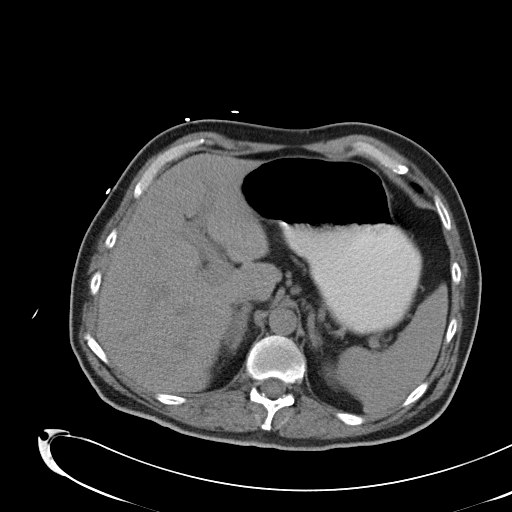
[im 91/103  soft-tissue]
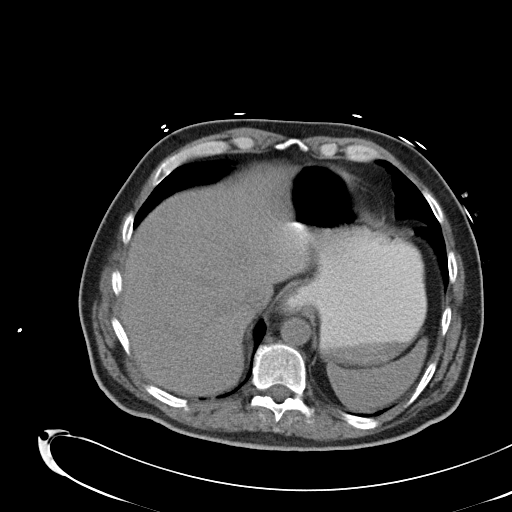
[im 99/103  soft-tissue]
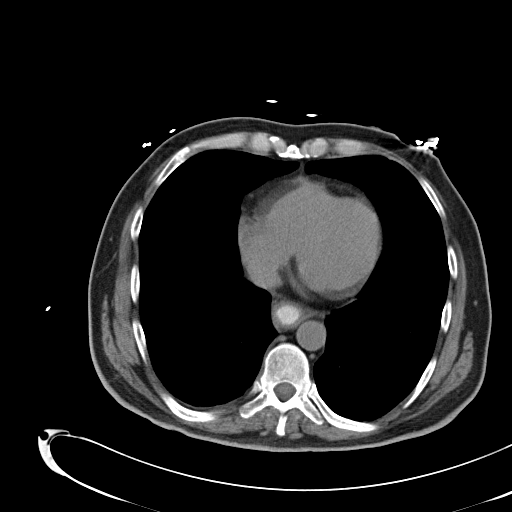

[Series 5: abd/pelv w/o cor · coronal · non-contrast · 1.04mm/px · 3 of 106 slices shown]
[im 36/106  soft-tissue]
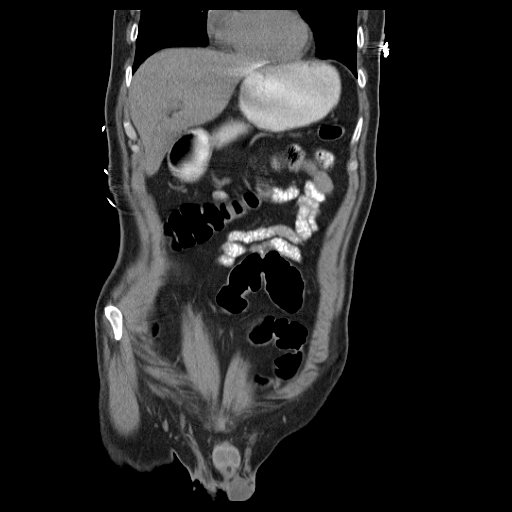
[im 47/106  soft-tissue]
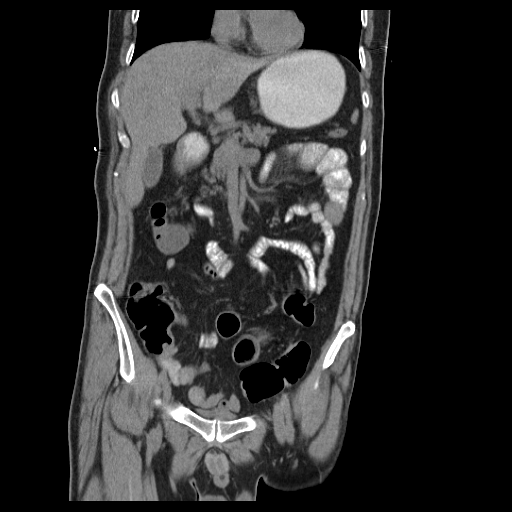
[im 59/106  soft-tissue]
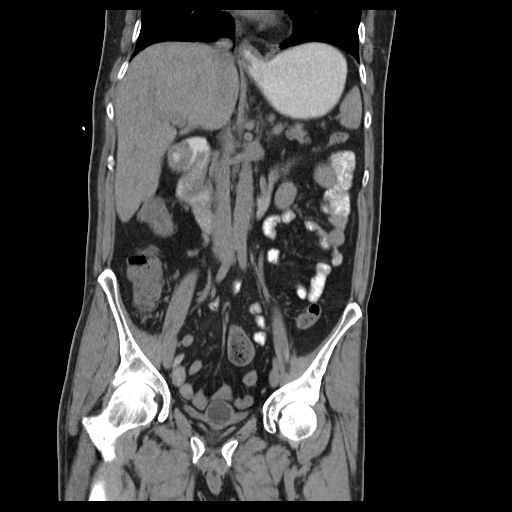

[17 of 46 positions shown; findings below may reference images not displayed]

FINDINGS: There is no evidence of renal calculi or hydronephrosis.
The other abdominal parenchymal organs are unremarkable in
appearance on this noncontrast study.  Gallbladder also appears
normal.  There is no evidence of mass or lymphadenopathy.  There is
no evidence of inflammatory process or abnormal fluid collections.
Abdominal bowel loops appear normal.
IMPRESSION: No acute findings.

CT PELVIS
FINDINGS: There is no evidence of ureteral calculi or dilatation.
Foley catheter is seen within the bladder.

There is no evidence of pelvic mass or lymphadenopathy.  There is
no evidence of inflammatory process or abnormal fluid collections.
Pelvic bowel loops appear normal.
IMPRESSION: No acute findings.  Foley catheter within empty urinary bladder.

## 2011-04-20 IMAGING — CR DG CHEST 1V PORT
2 series · 2 of 2 positions shown · non-contrast
Comparison: 09/30/2005

CLINICAL DATA: Weakness.  Fell.

PORTABLE CHEST - 1 VIEW

[AP (1 of 2)]
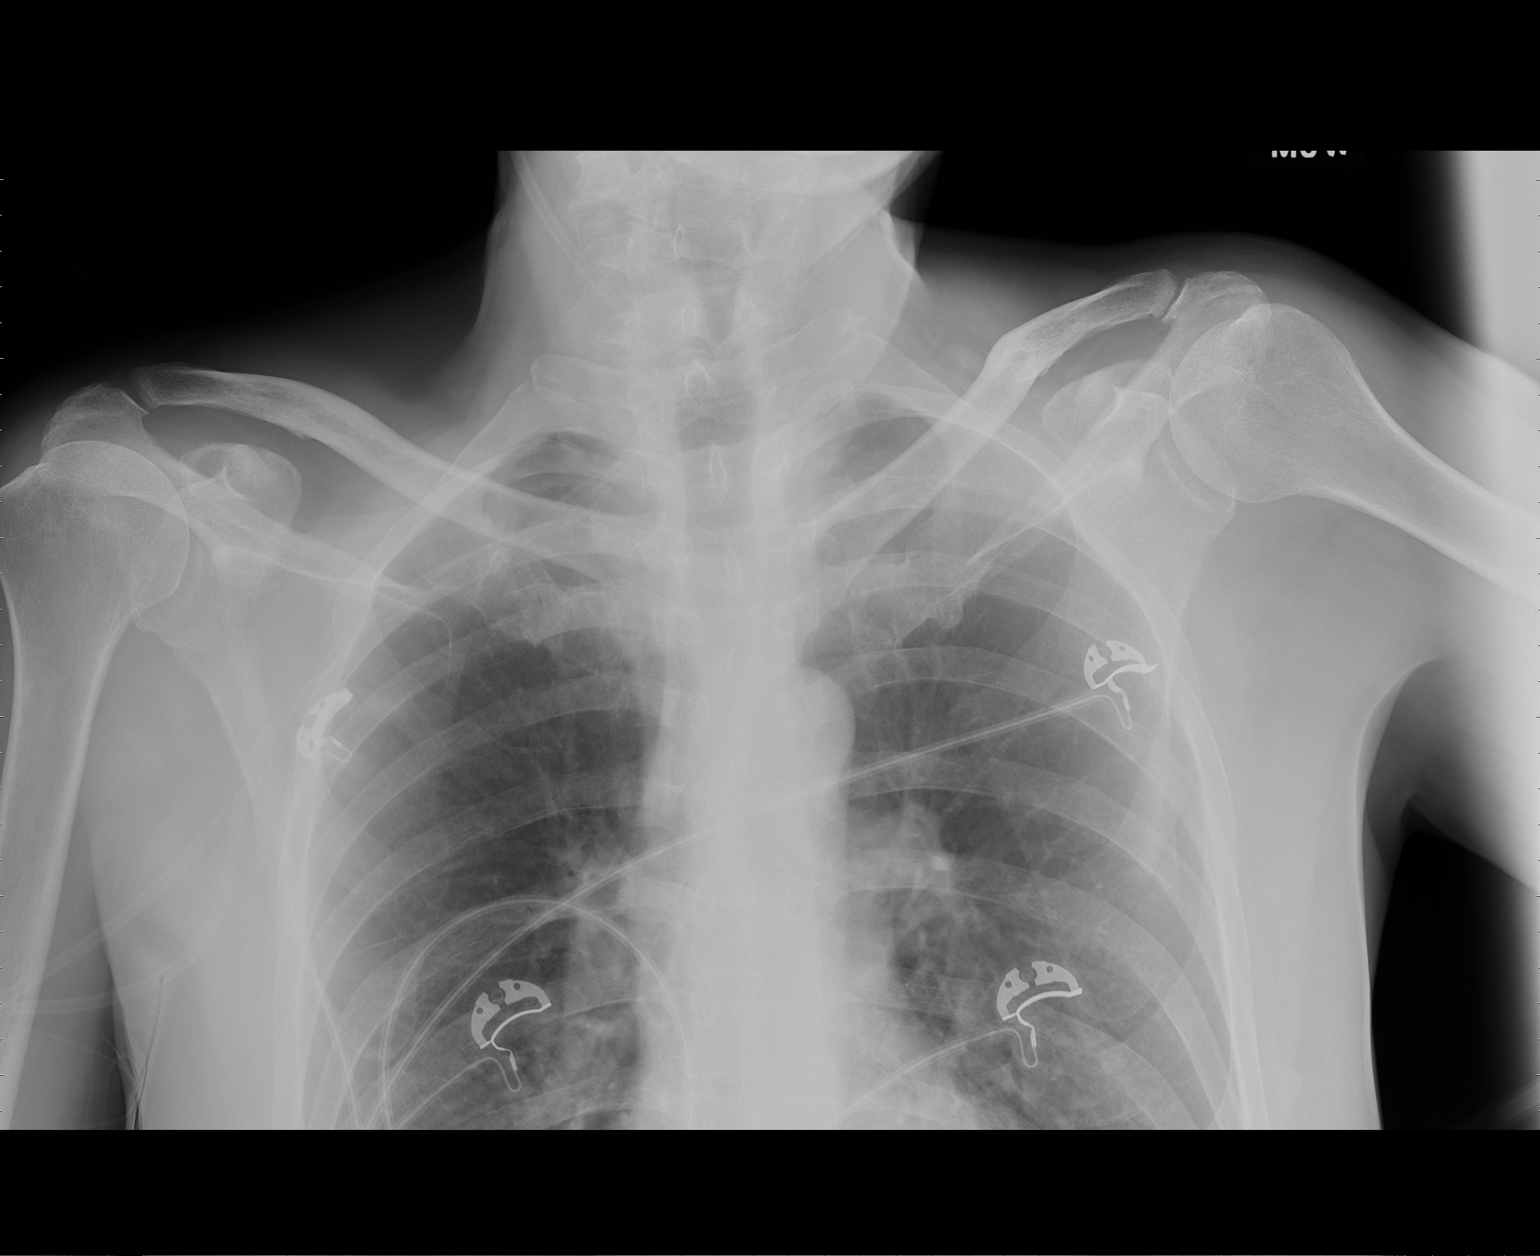

[AP (2 of 2)]
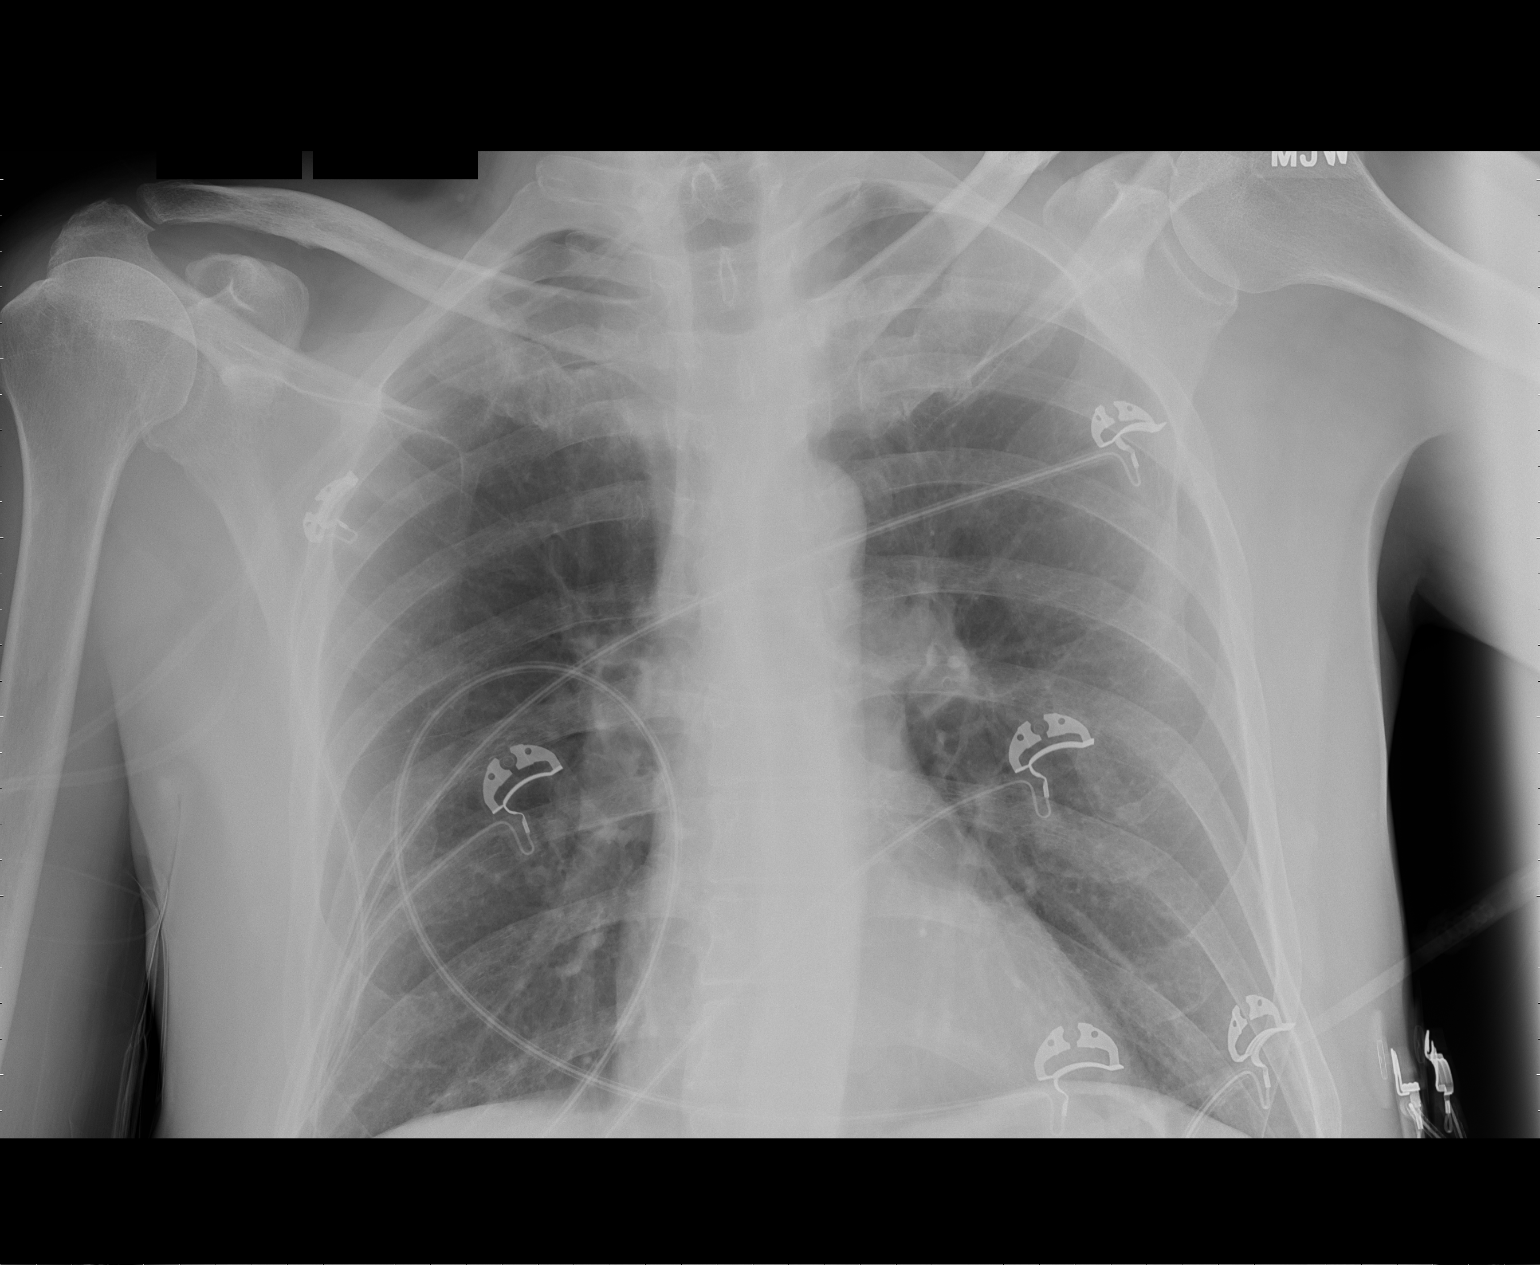

[2 of 2 positions shown; findings below may reference images not displayed]

FINDINGS: The cardiac silhouette, mediastinal and hilar contours
are within normal limits and stable.  Lungs are clear.  The bony
thorax intact.
IMPRESSION: No acute cardiopulmonary findings.

## 2016-09-17 ENCOUNTER — Emergency Department (HOSPITAL_COMMUNITY)
Admission: EM | Admit: 2016-09-17 | Discharge: 2016-09-17 | Disposition: A | Discharge status: Home / Self Care | Payer: Non-veteran care | Attending: Emergency Medicine | Admitting: Emergency Medicine

## 2016-09-17 ENCOUNTER — Encounter (HOSPITAL_COMMUNITY): Payer: Self-pay | Admitting: Neurology

## 2016-09-17 DIAGNOSIS — Z8673 Personal history of transient ischemic attack (TIA), and cerebral infarction without residual deficits: Secondary | ICD-10-CM | POA: Insufficient documentation

## 2016-09-17 DIAGNOSIS — E119 Type 2 diabetes mellitus without complications: Secondary | ICD-10-CM | POA: Diagnosis not present

## 2016-09-17 DIAGNOSIS — I252 Old myocardial infarction: Secondary | ICD-10-CM | POA: Insufficient documentation

## 2016-09-17 DIAGNOSIS — R55 Syncope and collapse: Secondary | ICD-10-CM | POA: Diagnosis not present

## 2016-09-17 DIAGNOSIS — R531 Weakness: Secondary | ICD-10-CM | POA: Insufficient documentation

## 2016-09-17 DIAGNOSIS — F17200 Nicotine dependence, unspecified, uncomplicated: Secondary | ICD-10-CM | POA: Insufficient documentation

## 2016-09-17 DIAGNOSIS — R4182 Altered mental status, unspecified: Secondary | ICD-10-CM | POA: Diagnosis present

## 2016-09-17 HISTORY — DX: Type 2 diabetes mellitus without complications: E11.9

## 2016-09-17 HISTORY — DX: Acute myocardial infarction, unspecified: I21.9

## 2016-09-17 HISTORY — DX: Cerebral infarction, unspecified: I63.9

## 2016-09-17 LAB — BASIC METABOLIC PANEL
Anion gap: 13 (ref 5–15)
BUN: 13 mg/dL (ref 6–20)
CO2: 23 mmol/L (ref 22–32)
Calcium: 8.9 mg/dL (ref 8.9–10.3)
Chloride: 100 mmol/L — ABNORMAL LOW (ref 101–111)
Creatinine, Ser: 1.07 mg/dL (ref 0.61–1.24)
GFR calc Af Amer: >60 mL/min (ref >60)
GFR calc non Af Amer: >60 mL/min (ref >60)
Glucose, Bld: 197 mg/dL — ABNORMAL HIGH (ref 65–99)
Potassium: 5 mmol/L (ref 3.5–5.1)
Sodium: 136 mmol/L (ref 135–145)

## 2016-09-17 LAB — CBC WITH DIFFERENTIAL/PLATELET
Basophils Absolute: 0 10*3/uL (ref 0.0–0.1)
Basophils Relative: 0 %
Eosinophils Absolute: 0 10*3/uL (ref 0.0–0.7)
Eosinophils Relative: 0 %
HCT: 44.2 % (ref 39.0–52.0)
Hemoglobin: 15.3 g/dL (ref 13.0–17.0)
Lymphocytes Relative: 23 %
Lymphs Abs: 1.8 10*3/uL (ref 0.7–4.0)
MCH: 31.1 pg (ref 26.0–34.0)
MCHC: 34.6 g/dL (ref 30.0–36.0)
MCV: 89.8 fL (ref 78.0–100.0)
Monocytes Absolute: 0.4 10*3/uL (ref 0.1–1.0)
Monocytes Relative: 5 %
Neutro Abs: 5.6 10*3/uL (ref 1.7–7.7)
Neutrophils Relative %: 72 %
Platelets: 270 10*3/uL (ref 150–400)
RBC: 4.92 MIL/uL (ref 4.22–5.81)
RDW: 11.9 % (ref 11.5–15.5)
WBC: 7.8 10*3/uL (ref 4.0–10.5)

## 2016-09-17 LAB — TROPONIN I: Troponin I: <0.03 ng/mL (ref <0.03)

## 2016-09-17 NOTE — ED Provider Notes (Signed)
[facility]-EMERGENCY DEPT Provider Note   CSN: [id number] Arrival date & time: 09/17/16  1345   By signing my name below, I, Glenn [name], attest that this documentation has been prepared under the direction and in the presence of Glenn [name], Kayvon Mo, MD. Electronically signed, Glenn [name], ED Scribe. 09/17/16. 2:24 PM.   History   Chief Complaint Chief Complaint  Patient presents with  . Altered Mental Status   The history is provided by the patient and medical records. No language interpreter was used.    Glenn [name] is a 61 y.o. male with h/o stroke, DM and MI presenting to the Emergency Department concerning an episode of near syncope PTA. Associated worsened weakness from baseline. Triage note states pt exhibited confusion, decreased concentration and decreased coordination at a gas station PTA. A bystander contact Emergency, who report the pt had low blood pressures and was resistant to treatment. Pt in town from [place] visiting a friend. H/o stroke noted with lingering R sided weakness. Pt states he feels weakness when standing at baseline chronically d/t this. Anticoagulant use noted. No ETOH use x "25 years". No h/o seizures. No pain, SOB or any other complaints noted at this time.    Past Medical History:  Diagnosis Date  . Diabetes mellitus without complication (HCC)   . MI (myocardial infarction) (HCC)   . Stroke [facility](HCC)     There are no active problems to display for this patient.   History reviewed. No pertinent surgical history.     Home Medications    Prior to Admission medications   Not on File    Family History No family history on file.  Social History Social History  Substance Use Topics  . Smoking status: Current Every Day Smoker  . Smokeless tobacco: Not on file  . Alcohol use No     Allergies   Patient has no known allergies.   Review of Systems Review of Systems  Constitutional: Negative for fever.  Respiratory: Negative for  shortness of breath.   Cardiovascular: Negative for chest pain.  Gastrointestinal: Negative for nausea and vomiting.  Musculoskeletal: Negative for back pain.  Skin: Negative for color change and wound.  Neurological: Positive for weakness. Negative for seizures, syncope, light-headedness and numbness.  Psychiatric/Behavioral: Positive for behavioral problems and confusion.  All other systems reviewed and are negative.    Physical Exam Updated Vital Signs BP 111/81 (BP Location: Left Arm)   Pulse (!) 101   Temp 99.8 F (37.7 C) (Temporal)   Resp 18   Ht 5\' 9"  (1.753 m)   Wt 145 lb (65.8 kg)   SpO2 100%   BMI 21.41 kg/m   Physical Exam  Constitutional: He is oriented to person, place, and time. He appears well-developed and well-nourished.  HENT:  Head: Normocephalic and atraumatic.  Eyes: EOM are normal.  Neck: Normal range of motion.  Cardiovascular: Normal rate, regular rhythm, normal heart sounds and intact distal pulses.   Pulmonary/Chest: Effort normal and breath sounds normal. No respiratory distress.  Abdominal: Soft. He exhibits no distension. There is no tenderness.  Musculoskeletal: Normal range of motion.  Neurological: He is alert and oriented to person, place, and time.  Dysarthric but understandable. 4/5 strength R side. R facial droop.  Skin: Skin is warm and dry.  Psychiatric: He has a normal mood and affect. Judgment normal.  Nursing note and vitals reviewed.    ED Treatments / Results  DIAGNOSTIC STUDIES: Oxygen Saturation is 100% on RA, NL by my interpretation.  COORDINATION OF CARE: 2:19 PM-Discussed next steps with pt. Pt verbalized understanding and is agreeable with the plan. Will order blood work and UA.   Labs (all labs ordered are listed, but only abnormal results are displayed) Labs Reviewed - No data to display  EKG  EKG Interpretation None       Radiology No results found.  Procedures Procedures (including critical care  time)  Medications Ordered in ED Medications - No data to display   Initial Impression / Assessment and Plan / ED Course  I have reviewed the triage vital signs and the nursing notes.  Pertinent labs & imaging results that were available during my care of the patient were reviewed by me and considered in my medical decision making (see chart for details).     61yM with deficits from prior stroke. He insists he feels like his normal self. He is not hostile towards ED staff. He says he simply has deficits from prior CVA and this is his normal. He doesn't feel like he needed evaluation. He finally relented because her felt like it was just easier to go along with bystanders and first responders then continue to argue with them. Stable in ED. W/u fairly unremarkable.   It has been determined that no acute conditions requiring further emergency intervention are present at this time. The patient has been advised of the diagnosis and plan. I reviewed any labs and imaging including any potential incidental findings. We have discussed signs and symptoms that warrant return to the ED and they are listed in the discharge instructions.    Final Clinical Impressions(s) / ED Diagnoses   Final diagnoses:  Near syncope    New Prescriptions New Prescriptions   No medications on file    I personally preformed the services scribed in my presence. The recorded information has been reviewed is accurate. Glenn Razor, MD.    Glenn Razor, MD 10/03/16 [phone]

## 2016-09-17 NOTE — ED Notes (Signed)
Dr. Juleen ChinaKohut reports patient can eat and drink. Given coke, [place] sandwich. Asked for urine sample, reports unable to provide at this time.

## 2016-09-17 NOTE — ED Triage Notes (Addendum)
Per ems- pt comes from gas station where he was witnessed stumbling into station, stood in line to pay, and cashier witnessed him staring off for several seconds, she called EMS. They arrived found pt sitting in his car, with weak radials rate of 84, was confused and hostile. Had to call PD to get him out of car. Had right sided weakness, which he reports is baseline after previous CVA. Pt was combative, does not want to be in the hospital. Is from [place], drove here to visit a friend. BP 90/45, CBG 225. Pt is cooperative at this time, is texting.

## 2016-09-17 NOTE — ED Notes (Signed)
Pt verbalized understanding of d/c instructions and has no further questions. Pt alert and oriented x 4. Following all commands. Pt d/c home with family.

## 2017-01-03 DEATH — deceased
